# Patient Record
Sex: Male | Born: 1980 | Race: White | Hispanic: No | Smoking: Current every day smoker
Health system: Southern US, Community
[De-identification: ages and names within clinical notes are randomized; demographics above are authoritative.]

## PROBLEM LIST (undated history)

## (undated) DIAGNOSIS — F419 Anxiety disorder, unspecified: Secondary | ICD-10-CM

## (undated) DIAGNOSIS — F909 Attention-deficit hyperactivity disorder, unspecified type: Secondary | ICD-10-CM

---

## 2009-08-15 ENCOUNTER — Emergency Department: Payer: Self-pay | Admitting: Emergency Medicine

## 2010-05-04 ENCOUNTER — Inpatient Hospital Stay: Payer: Self-pay | Admitting: Psychiatry

## 2011-05-27 ENCOUNTER — Emergency Department: Payer: Self-pay | Admitting: Emergency Medicine

## 2011-07-25 IMAGING — CR DG CHEST 2V
1 series · 2 of 2 positions shown · non-contrast
Comparison: none

REASON FOR EXAM: SOB
COMMENTS:

[Series 1: view not recorded · 0.17mm/px · 2 of 2 slices shown]
[im 1/2]
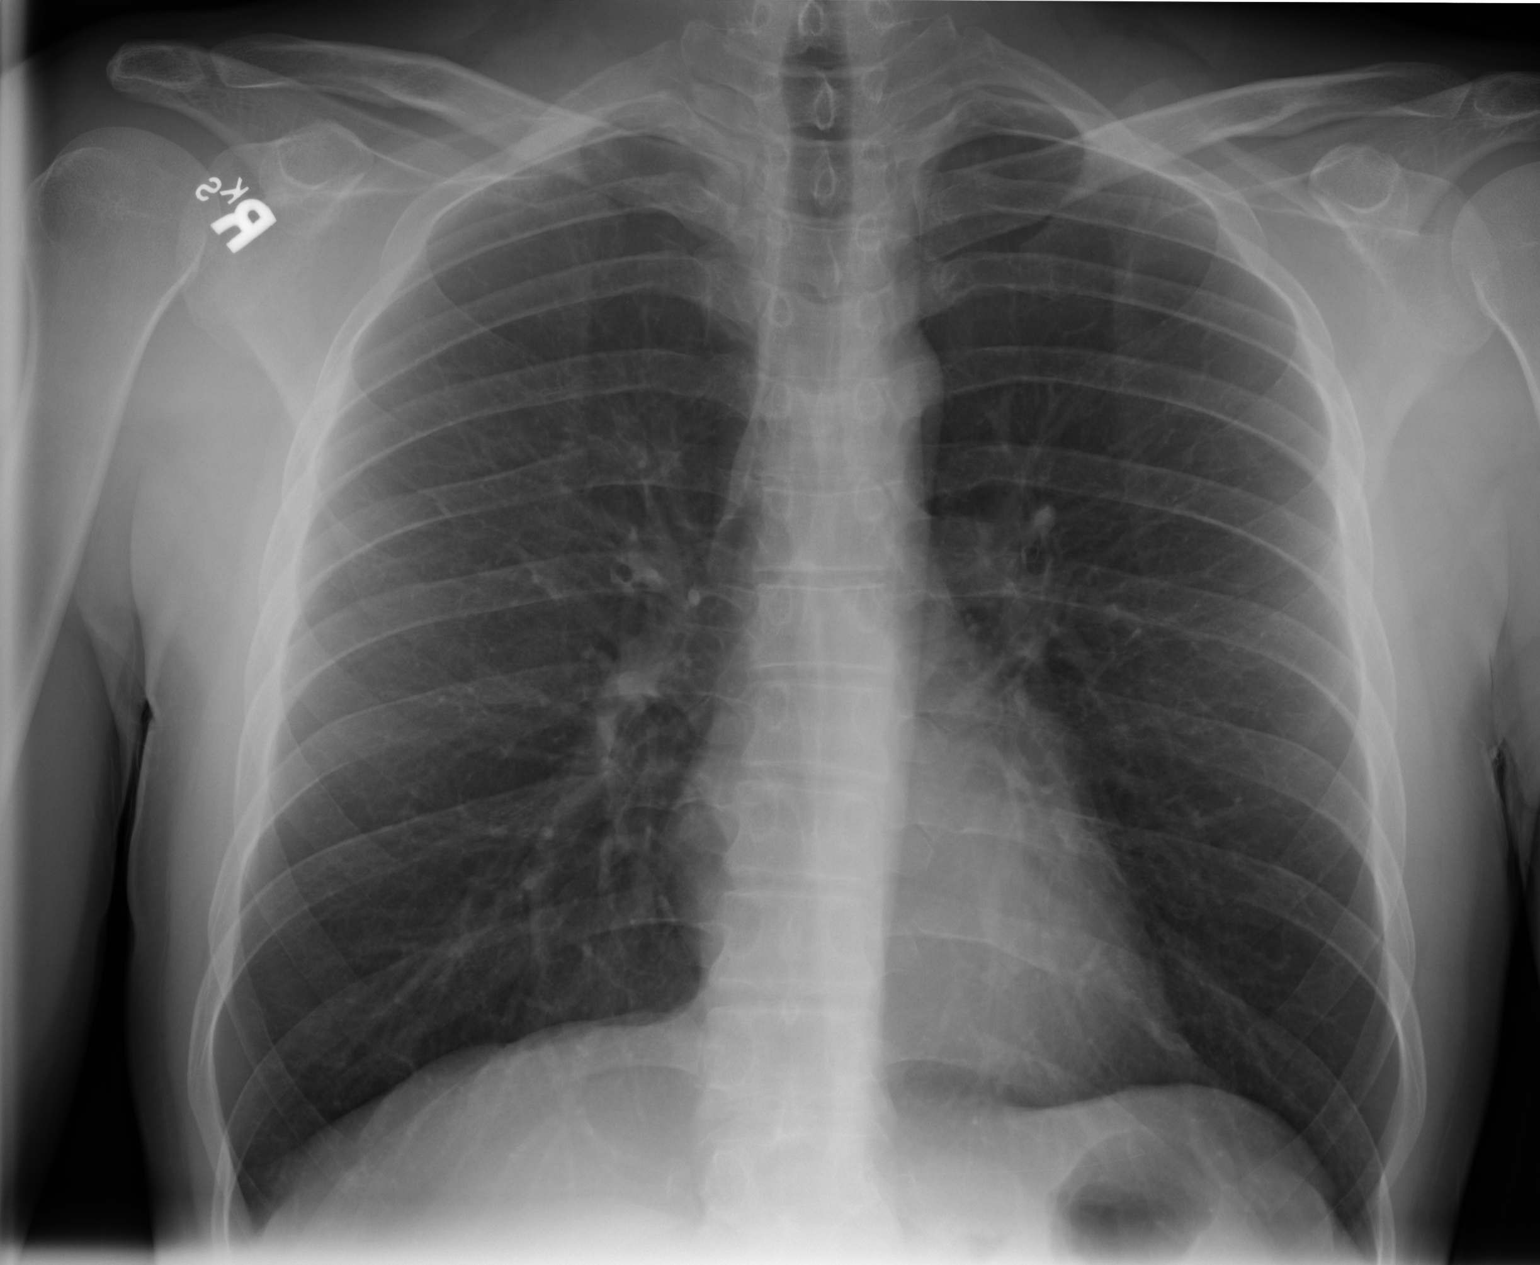
[im 2/2]
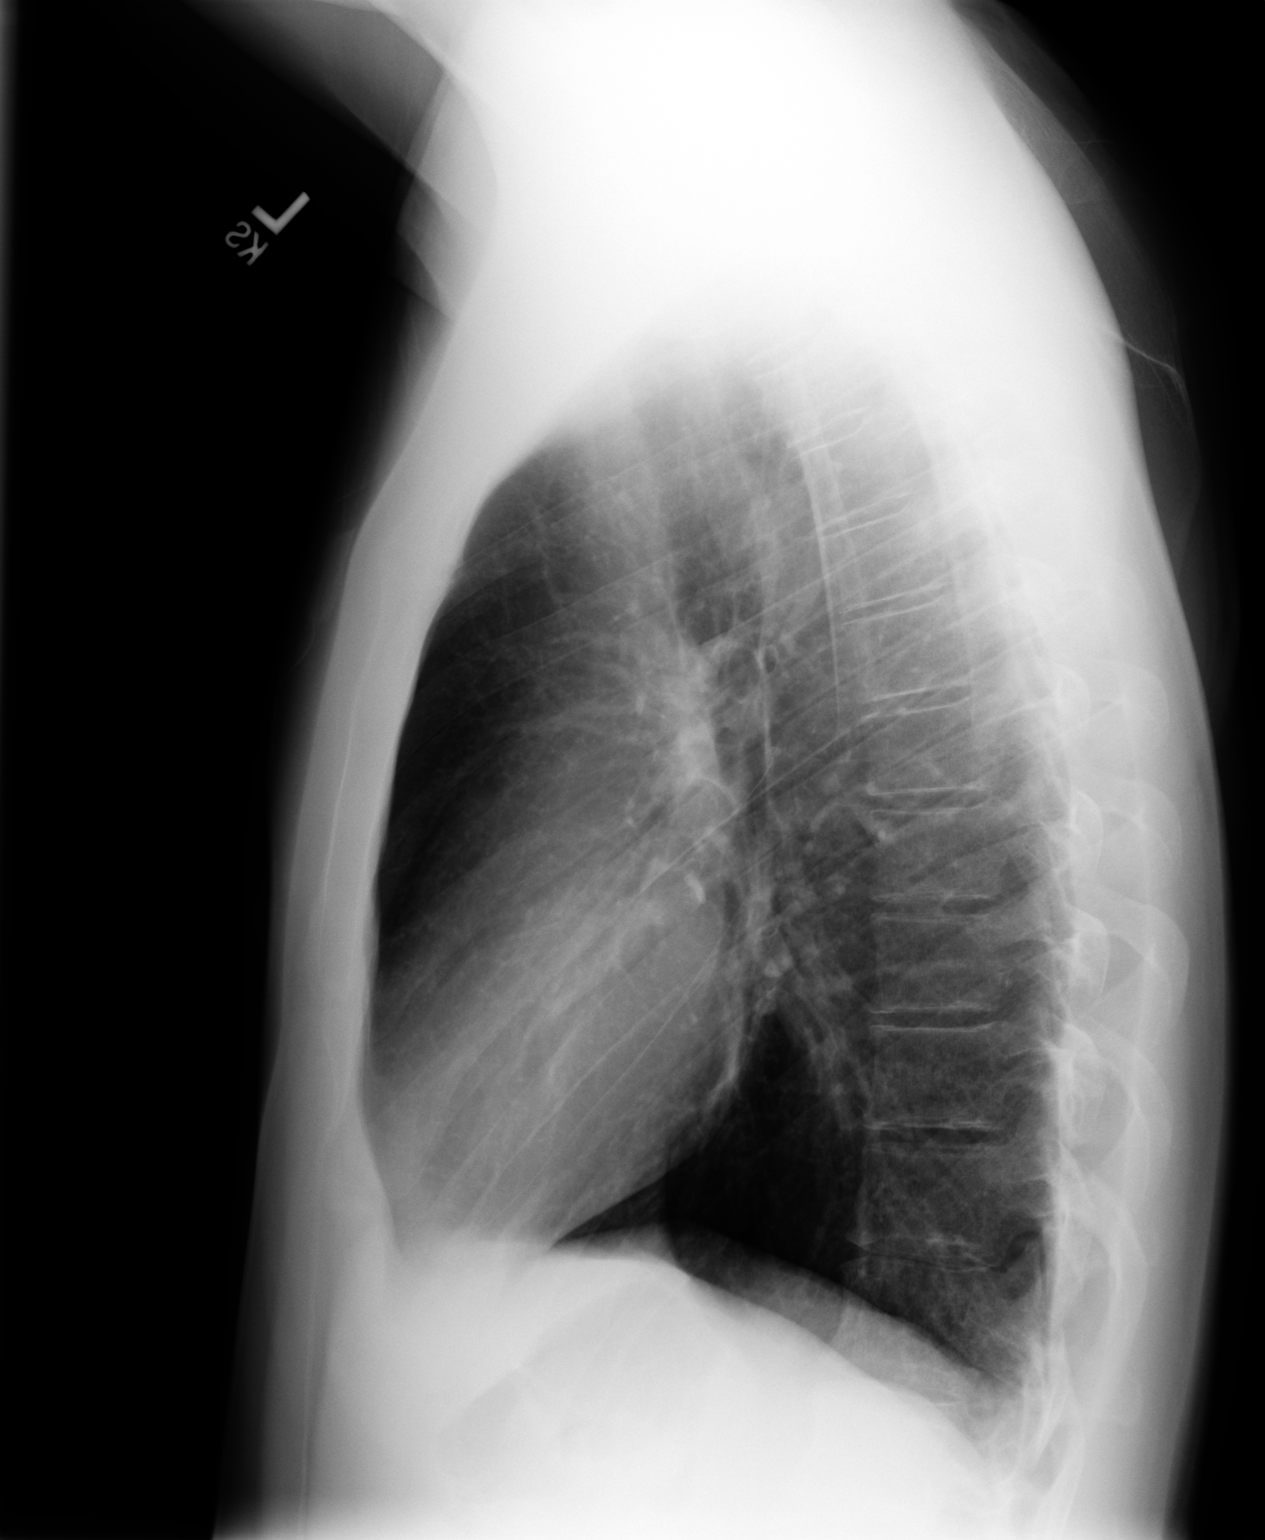

[2 of 2 positions shown; findings below may reference images not displayed]

PROCEDURE:     DXR - DXR CHEST PA (OR AP) AND LATERAL  - August 15, 2009  [DATE]

RESULT:     There is no previous exam for comparison.

The lungs are clear. The heart and pulmonary vessels are normal. The bony
and mediastinal structures are unremarkable. There is no effusion. There is
no pneumothorax or evidence of congestive failure.
IMPRESSION: No acute cardiopulmonary disease.

## 2013-05-16 ENCOUNTER — Emergency Department: Payer: Self-pay | Admitting: Emergency Medicine

## 2014-10-25 IMAGING — CR DG CHEST 2V
1 series · 1 of 1 positions shown · non-contrast
Comparison: none

REASON FOR EXAM: pain
COMMENTS:

PROCEDURE:     DXR - DXR CHEST PA (OR AP) AND LATERAL  - May 16, 2013  [DATE]
RESULT:     No acute cardiopulmonary disease. Bony structures are
unremarkable.

[w chest lat]
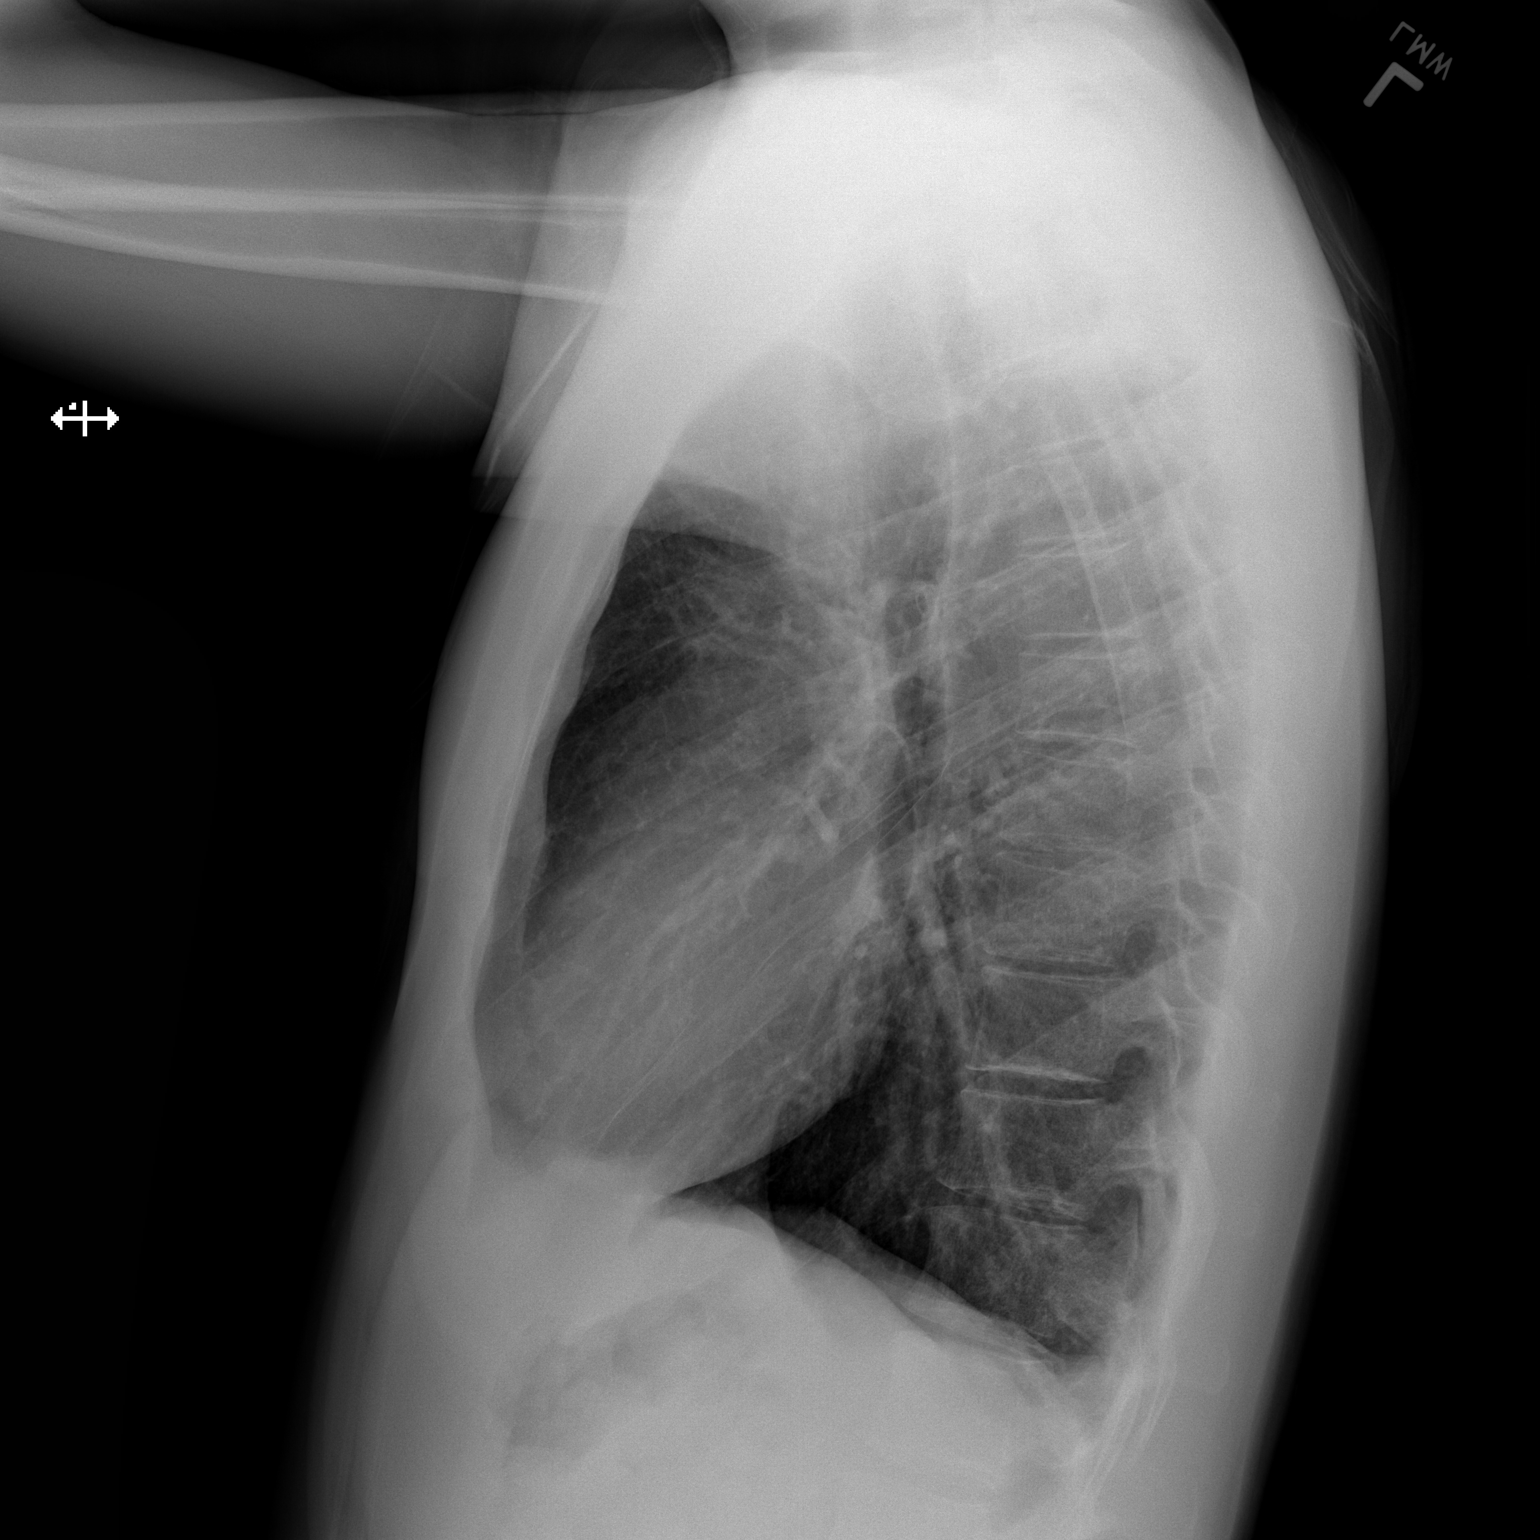

[1 of 1 positions shown; findings below may reference images not displayed]

IMPRESSION: No acute abnormality. Chest stable from 08/15/2009.

## 2016-07-07 ENCOUNTER — Encounter: Payer: Self-pay | Admitting: Emergency Medicine

## 2016-07-07 ENCOUNTER — Emergency Department
Admission: EM | Admit: 2016-07-07 | Discharge: 2016-07-07 | Disposition: A | Discharge status: Home / Self Care | Payer: Self-pay | Attending: Emergency Medicine | Admitting: Emergency Medicine

## 2016-07-07 DIAGNOSIS — F17210 Nicotine dependence, cigarettes, uncomplicated: Secondary | ICD-10-CM | POA: Insufficient documentation

## 2016-07-07 DIAGNOSIS — A09 Infectious gastroenteritis and colitis, unspecified: Secondary | ICD-10-CM | POA: Insufficient documentation

## 2016-07-07 DIAGNOSIS — F909 Attention-deficit hyperactivity disorder, unspecified type: Secondary | ICD-10-CM | POA: Insufficient documentation

## 2016-07-07 HISTORY — DX: Anxiety disorder, unspecified: F41.9

## 2016-07-07 HISTORY — DX: Attention-deficit hyperactivity disorder, unspecified type: F90.9

## 2016-07-07 LAB — URINALYSIS COMPLETE WITH MICROSCOPIC (ARMC ONLY)
Bacteria, UA: NONE SEEN
Bilirubin Urine: NEGATIVE
Glucose, UA: NEGATIVE mg/dL
HGB URINE DIPSTICK: NEGATIVE
Ketones, ur: NEGATIVE mg/dL
Leukocytes, UA: NEGATIVE
NITRITE: NEGATIVE
PROTEIN: NEGATIVE mg/dL
RBC / HPF: NONE SEEN RBC/hpf (ref 0–5)
SPECIFIC GRAVITY, URINE: 1.004 — AB (ref 1.005–1.030)
Squamous Epithelial / LPF: NONE SEEN
pH: 7 (ref 5.0–8.0)

## 2016-07-07 LAB — COMPREHENSIVE METABOLIC PANEL
ALK PHOS: 54 U/L (ref 38–126)
ALT: 23 U/L (ref 17–63)
ANION GAP: 9 (ref 5–15)
AST: 27 U/L (ref 15–41)
Albumin: 4.4 g/dL (ref 3.5–5.0)
BILIRUBIN TOTAL: 0.5 mg/dL (ref 0.3–1.2)
BUN: 8 mg/dL (ref 6–20)
CALCIUM: 9.4 mg/dL (ref 8.9–10.3)
CO2: 29 mmol/L (ref 22–32)
Chloride: 101 mmol/L (ref 101–111)
Creatinine, Ser: 0.78 mg/dL (ref 0.61–1.24)
GFR calc non Af Amer: >60 mL/min (ref >60)
Glucose, Bld: 106 mg/dL — ABNORMAL HIGH (ref 65–99)
Potassium: 3.8 mmol/L (ref 3.5–5.1)
SODIUM: 139 mmol/L (ref 135–145)
TOTAL PROTEIN: 7.9 g/dL (ref 6.5–8.1)

## 2016-07-07 LAB — LIPASE, BLOOD: Lipase: 27 U/L (ref 11–51)

## 2016-07-07 LAB — CBC
HCT: 45.5 % (ref 40.0–52.0)
HEMOGLOBIN: 16.1 g/dL (ref 13.0–18.0)
MCH: 32 pg (ref 26.0–34.0)
MCHC: 35.5 g/dL (ref 32.0–36.0)
MCV: 90.2 fL (ref 80.0–100.0)
Platelets: 220 10*3/uL (ref 150–440)
RBC: 5.05 MIL/uL (ref 4.40–5.90)
RDW: 12.4 % (ref 11.5–14.5)
WBC: 4.9 10*3/uL (ref 3.8–10.6)

## 2016-07-07 MED ORDER — ONDANSETRON HCL 4 MG/2ML IJ SOLN
4.0000 mg | Freq: Once | INTRAMUSCULAR | Status: AC
  Administered 2016-07-07: 4 mg via INTRAVENOUS

## 2016-07-07 MED ORDER — SODIUM CHLORIDE 0.9 % IV BOLUS (SEPSIS)
1000.0000 mL | Freq: Once | INTRAVENOUS | Status: AC
  Administered 2016-07-07: 1000 mL via INTRAVENOUS

## 2016-07-07 MED ORDER — ONDANSETRON 4 MG PO TBDP: 4.0000 mg | ORAL_TABLET | Freq: Four times a day (QID) | ORAL | 0 refills | Status: AC | PRN

## 2016-07-07 MED ORDER — ONDANSETRON HCL 4 MG/2ML IJ SOLN
INTRAMUSCULAR | Status: AC
  Administered 2016-07-07: 4 mg via INTRAVENOUS
  Filled 2016-07-07: qty 2

## 2016-07-07 MED ORDER — LOPERAMIDE HCL 2 MG PO CAPS
2.0000 mg | ORAL_CAPSULE | ORAL | Status: AC
  Administered 2016-07-07: 2 mg via ORAL
  Filled 2016-07-07 (×2): qty 1

## 2016-07-07 MED ORDER — ONDANSETRON HCL 4 MG/2ML IJ SOLN
4.0000 mg | Freq: Once | INTRAMUSCULAR | Status: AC | PRN
  Administered 2016-07-07: 4 mg via INTRAVENOUS
  Filled 2016-07-07: qty 2

## 2016-07-07 NOTE — ED Triage Notes (Signed)
Patient also reports he has not been urinating as much either states he may only go when he is having diarrhea but does not wake up and have to void.

## 2016-07-07 NOTE — ED Notes (Signed)
Pt also reports nausea for the past 5 days.

## 2016-07-07 NOTE — ED Provider Notes (Signed)
[facility] Emergency Department Provider Note   ____________________________________________   First MD Initiated Contact with Patient 07/07/16 1533     (approximate)  I have reviewed the triage vital signs and the nursing notes.   HISTORY  Chief Complaint Diarrhea and Dehydration    HPI Antonio Schmidt Hageman. is a 35 y.o. 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  Patient reports that for the last 5 days he has had large amounts of watery diarrhea. A couple days ago he's having diarrhea up to every 30 minutes, it is not bloody and he describes as very watery in nature.Not associatl or recent ac use. 2 of his sons have also began to experience similar symptoms, and one was seenat the doctor's office this week and was told he had some type of viral infection.  Mild nausea, occasional cramps throu abdomen, the patient felt very "dehydrated"he feels mucd at this time. Reportsr receiving some IV fluid he's much better.Currently de pain, but does report occasionally feeling cramping.   Past Medical History:  Diagnosis Date  . ADHD (attention deficit hyperactivity disorder)   . Anxiety     There are no active problems to display for this patient.   History reviewed. No pertinent surgical history.  Prior to Admission medications   Medication Sig Start Date End Date Taking? Authorizing Provider  ondansetron (ZOFRAN ODT) 4 MG disintegrating tablet Take 1 tablet (4 mg total) by mouth every 6 (six) hours as needed for nausea or vomiting. 07/07/16   Sharyn Creamer, MD    Allergies Review of patient's allergies indicates no known allergies.  History reviewed. No pertinent family history.  Social History Social History  Substance Use Topics  . Smoking status: Current Every Day Smoker    Packs/day: 1.00    Types: Cigarettes  . Smokeless tobacco: Never Used  . Alcohol use No    Review of  Systems Constitutional: No fever/chills. Feels dry and dehyd Eyes: No visual changes. ENT: No sore throat. Cardiovascular: Denies chest pain. Respiratory: Denies shortness of breath. Gastrointestinal: No abdominal pain.  No nausea, no vomiting.   No constipation. Genitourinary: Negative for dysuria.reports he only urinated once yesterday. Musculoskeletal: Negative for back pain. Skin: Negative for rash. Neurological: Negative for headaches, focal weakness or numbness.  oes report he  10-point ROS otherwise negative.  ____________________________________________   PHYSICAL EXAM:  VITAL SIGNS: ED Triage Vitals [07/07/16 1338]  Enc Vitals Group     BP 130/85     Pulse Rate (!) 107     Resp 20     Temp 98.2 F (36.8 C)     Temp src      SpO2 99 %     Weight 155 lb (70.3 kg)     Height 6' (1.829 m)     Head Circumference      Peak Flow      Pain Score 7     Pain Loc      Pain Edu?      Excl. in GC?     Constitutional: Alert and oriented. Well appearing and in no acute distress. Eyes: Conjunctivae are normal. PERRL. EOMI. Head: Atraumatic. Nose: No congestion/rhinnorhea. Mouth/Throat: Mucous membranes are Slightly dry.  Oropharynx non-erythematous. Neck: No stridor.   Cardiovascular: Normal rate, regular rhythm. Grossly normal heart sounds.  Heart rate 80. Good peripheral circulation. Respiratory: Normal respiratory effort.  No retractions. Lungs CTAB. Gastrointestinal: Soft and nontender.  No distention. No abdominal bruits. No CVA tenderness. No pain in McBurney's point. No rebound or guarding. No evidence of acute abdomen or peritonitis. Musculoskeletal: No lower extremity tenderness nor edema.  No joint effusions. Neurologic:  Normal speech and language. No gross focal neurologic deficits are appreciated. Skin:  Skin is warm, dry and intact. No rash noted. Psychiatric: Mood and affect are normal. Speech and behavior are  normal.  ____________________________________________   LABS (all labs ordered are listed, but only abnormal results are displayed)  Labs Reviewed  COMPREHENSIVE METABOLIC PANEL - Abnormal; Notable for the following:       Result Value   Glucose, Bld 106 (*)    All other components within normal limits  URINALYSIS COMPLETEWITH MICROSCOPIC ([facility] ONLY) - Abnormal; Notable for the following:    Color, Urine YELLOW (*)    APPearance CLEAR (*)    Specific Gravity, Urine 1.004 (*)    All other components within normal limits  GASTROINTESTINAL PANEL BY PCR, STOOL (REPLACES STOOL CULTURE)  C DIFFICILE QUICK SCREEN W PCR REFLEX  LIPASE, BLOOD  CBC   ____________________________________________  EKG  Reviewed and to room me at 1355 Ventricular rate 80 PR 140 QRS 90 Normal sinus rhythm, no evidence of ischemia. Mild sinus arrhythmia. Possible right atrial enlargement is noted. ____________________________________________  RADIOLOGY  I discussed with the patient the risks and benefits of abdominal CT scan. The present time there is no clear indication that the patient requires CT, the patient does have an abdominal complaint but exam does not suggest acute surgical abdomen and my suspicion for intra-abdominal infection including appendicitis, cholecystitis, aaa, dissection, ischemia, perforation, pancreatitis, diverticulitis or other acute major intra-abdominal process is quite low. After discussing the risks and benefits including benefits of additional evaluation for diagnoses, ruling out infection/perforation/aaa/etc, but also discussing the risks including low, "well less than 1%," but not 0 risk of inducing cancers due to radiation and potential risks of contrast the patient indicated via our shared medical decision-making that she would not do a CAT scan. Rather if the patient does have worsening symptoms, develops a high fever, develops pain or persistent discomfort in the right  upper quadrant or right lower quadrant, or other new concerns arise they will come back to emergency room right away. As the patient's clinician I think this is a very reasonable decision having discussed general risks and benefits of CT, and my clinical suspicion that CT would be of benefit at this time is very low.  ____________________________________________   PROCEDURES  Procedure(s) performed: None  Procedures  Critical Care performed: No  ____________________________________________   INITIAL IMPRESSION / ASSESSMENT AND PLAN / ED COURSE  Pertinent labs & imaging results that were available during my care of the patient were reviewed by me and considered in my medical decision making (see chart for details).  Patient presents with large volume watery stool for the last 5 days. Feels much improved and appears nontoxic and stable. He is received 2 L of hydration reports he feels much better. Very reassuring exam, does not appear consistent with acute peritonitis or obvious perforation/intra-abdominal catastrophe. Plan unlikely to represent appendicitis, currently afebrile with normal white count, no focal right lower quadrant tenderness. After shared medical decision making, we will forego CT scan. We will treat him symptomatically, and have obtained stool cultures as I am mildly concerned at least about the possibility of infectious diarrhea whether it be viral or bacterial somewhat unclear.  Clinical Course    ----------------------------------------- 4:45 PM  on 07/07/2016 -----------------------------------------  Patient reports feels much improved. Resting comfortably, conversant. Patient comfortable the plan to treat conservatively at home with careful return precautions discussed with both patient and his mother.  Return precautions and treatment recommendations and follow-up discussed with the patient who is agreeable with the plan.  He was not able to have another bowel  movement or provide stool for follow-up and culture here today. ____________________________________________   FINAL CLINICAL IMPRESSION(S) / ED DIAGNOSES  Final diagnoses:  Infectious diarrhea in adult patient      NEW MEDICATIONS STARTED DURING THIS VISIT:  New Prescriptions   ONDANSETRON (ZOFRAN ODT) 4 MG DISINTEGRATING TABLET    Take 1 tablet (4 mg total) by mouth every 6 (six) hours as needed for nausea or vomiting.     Note:  This document was prepared using Dragon voice recognition software and may include unintentional dictation errors.     Sharyn CreamerMark Quale, MD 07/07/16 [phone]

## 2016-07-07 NOTE — Discharge Instructions (Signed)

## 2016-07-07 NOTE — ED Triage Notes (Signed)
Pt presents to ED with c/o sharp abdominal cramping and diarrhea for the past 5 days. Pt reports liquid diarrhea almost every 30 minutes. Pt states he did vomit 2 days ago.  Pt states he has been drinking ginger ale but still feels dehydrated.  Pt reports subjective fevers as well. Pt's child has been ill in the past week as well with stomach virus.  Pt's voice is also hoarse and mucus membranes are dry.

## 2016-07-07 NOTE — ED Notes (Signed)
Pt attempted to give stool sample but was unsuccessful

## 2021-07-08 ENCOUNTER — Other Ambulatory Visit (HOSPITAL_COMMUNITY): Payer: Self-pay | Admitting: Emergency Medicine

## 2021-07-08 ENCOUNTER — Other Ambulatory Visit: Payer: Self-pay | Admitting: Emergency Medicine

## 2021-07-08 DIAGNOSIS — R600 Localized edema: Secondary | ICD-10-CM

## 2023-04-05 ENCOUNTER — Emergency Department
Admission: EM | Admit: 2023-04-05 | Discharge: 2023-04-05 | Disposition: A | Discharge status: Home / Self Care | Payer: 59 | Attending: Emergency Medicine | Admitting: Emergency Medicine

## 2023-04-05 ENCOUNTER — Emergency Department: Payer: 59

## 2023-04-05 ENCOUNTER — Other Ambulatory Visit: Payer: Self-pay

## 2023-04-05 DIAGNOSIS — R600 Localized edema: Secondary | ICD-10-CM | POA: Diagnosis not present

## 2023-04-05 DIAGNOSIS — R0789 Other chest pain: Secondary | ICD-10-CM | POA: Diagnosis not present

## 2023-04-05 DIAGNOSIS — M7989 Other specified soft tissue disorders: Secondary | ICD-10-CM | POA: Diagnosis not present

## 2023-04-05 DIAGNOSIS — R079 Chest pain, unspecified: Secondary | ICD-10-CM | POA: Diagnosis not present

## 2023-04-05 LAB — BASIC METABOLIC PANEL
Anion gap: 9 (ref 5–15)
BUN: 18 mg/dL (ref 6–20)
CO2: 27 mmol/L (ref 22–32)
Calcium: 9.1 mg/dL (ref 8.9–10.3)
Chloride: 103 mmol/L (ref 98–111)
Creatinine, Ser: 0.99 mg/dL (ref 0.61–1.24)
GFR, Estimated: >60 mL/min (ref >60)
Glucose, Bld: 110 mg/dL — ABNORMAL HIGH (ref 70–99)
Potassium: 3.7 mmol/L (ref 3.5–5.1)
Sodium: 139 mmol/L (ref 135–145)

## 2023-04-05 LAB — CBC
HCT: 41 % (ref 39.0–52.0)
Hemoglobin: 13.9 g/dL (ref 13.0–17.0)
MCH: 31.2 pg (ref 26.0–34.0)
MCHC: 33.9 g/dL (ref 30.0–36.0)
MCV: 91.9 fL (ref 80.0–100.0)
Platelets: 215 10*3/uL (ref 150–400)
RBC: 4.46 MIL/uL (ref 4.22–5.81)
RDW: 12.1 % (ref 11.5–15.5)
WBC: 6.1 10*3/uL (ref 4.0–10.5)
nRBC: 0 % (ref 0.0–0.2)

## 2023-04-05 LAB — TROPONIN I (HIGH SENSITIVITY): Troponin I (High Sensitivity): <2 ng/L (ref <18)

## 2023-04-05 MED ORDER — FUROSEMIDE 20 MG PO TABS: 20.0000 mg | ORAL_TABLET | Freq: Every day | ORAL | 1 refills | Status: AC

## 2023-04-05 NOTE — ED Provider Notes (Signed)
[facility] Provider Note    Event Date/Time   First MD Initiated Contact with Patient 04/05/23 1759     (approximate)   History   Leg swelling   HPI  Antonio Schmidt. is a 42 y.o. male who reports lower extremity swelling over the last several months, left greater than right but swelling is bilateral.  Presents today because of redness that is developed in the anterior aspect of the lower left leg.  No pain.  No shortness of breath, no chest pain, no history of DVTs     Physical Exam   Triage Vital Signs: ED Triage Vitals  Enc Vitals Group     BP 04/05/23 1729 (!) 155/106     Pulse Rate 04/05/23 1729 (!) 102     Resp 04/05/23 1729 20     Temp 04/05/23 1729 98.2 F (36.8 C)     Temp src --      SpO2 04/05/23 1729 100 %     Weight 04/05/23 1730 93 kg (205 lb)     Height 04/05/23 1730 1.829 m (6')     Head Circumference --      Peak Flow --      Pain Score 04/05/23 1729 2     Pain Loc --      Pain Edu? --      Excl. in GC? --     Most recent vital signs: Vitals:   04/05/23 1729  BP: (!) 155/106  Pulse: (!) 102  Resp: 20  Temp: 98.2 F (36.8 C)  SpO2: 100%     General: Awake, no distress.  CV:  Good peripheral perfusion.  Resp:  Normal effort.  Clear to auscultation bilaterally Abd:  No distention.  Other:  Lower extremity edema bilaterally, left greater than right, 2+ on the left, 1+ in the right, mild erythema to the anterior lower extremity, no tenderness to palpation   ED Results / Procedures / Treatments   Labs (all labs ordered are listed, but only abnormal results are displayed) Labs Reviewed  BASIC METABOLIC PANEL - Abnormal; Notable for the following components:      Result Value   Glucose, Bld 110 (*)    All other components within normal limits  CBC  TROPONIN I (HIGH SENSITIVITY)  TROPONIN I (HIGH SENSITIVITY)     EKG  ED ECG REPORT I, Jene Every, the attending physician, personally viewed and  interpreted this ECG.  Date: 04/05/2023  Rhythm: normal sinus rhythm QRS Axis: normal Intervals: normal ST/T Wave abnormalities: normal Narrative Interpretation: no evidence of acute ischemia    RADIOLOGY Chest x-ray viewed interpret by me, no acute abnormality    PROCEDURES:  Critical Care performed:   Procedures   MEDICATIONS ORDERED IN ED: Medications - No data to display   IMPRESSION / MDM / ASSESSMENT AND PLAN / ED COURSE  I reviewed the triage vital signs and the nursing notes. Patient's presentation is most consistent with acute complicated illness / injury requiring diagnostic workup.   Patient presents with lower extremity swelling as detailed above.  Differential includes kidney injury, bilateral DVTs, infection  Lab work is overall reassuring, white blood cell count is normal, afebrile, not consistent with infection, no tenderness or pain  Will send for ultrasound to rule out DVTs  Ultrasound negative for DVTs, chest x-ray is unremarkable, will start the patient on low-dose Lasix, PCP referral placed     FINAL CLINICAL IMPRESSION(S) / ED DIAGNOSES  Final diagnoses:  Peripheral edema     Rx / DC Orders   ED Discharge Orders          Ordered    Ambulatory Referral to Primary Care (Establish Care)        04/05/23 1918    furosemide (LASIX) 20 MG tablet  Daily        04/05/23 1918             Note:  This document was prepared using Dragon voice recognition software and may include unintentional dictation errors.   Jene Every, MD 04/05/23 [phone]

## 2023-04-05 NOTE — ED Triage Notes (Signed)
Pt to ED for chest pain started last night. Reports swelling to left leg for the past few months but is now developing redness.  +nausea.

## 2023-04-24 ENCOUNTER — Ambulatory Visit: Payer: 59 | Admitting: Family Medicine

## 2024-02-02 ENCOUNTER — Other Ambulatory Visit: Payer: Self-pay | Admitting: Medical Genetics

## 2024-06-20 ENCOUNTER — Other Ambulatory Visit: Payer: Self-pay

## 2024-06-24 ENCOUNTER — Other Ambulatory Visit
Admission: RE | Admit: 2024-06-24 | Discharge: 2024-06-24 | Disposition: A | Discharge status: Home / Self Care | Payer: Self-pay | Source: Ambulatory Visit | Attending: Medical Genetics | Admitting: Medical Genetics

## 2024-07-01 LAB — GENECONNECT MOLECULAR SCREEN: Genetic Analysis Overall Interpretation: NEGATIVE
